# Patient Record
Sex: Female | Born: 1950 | Race: White | Hispanic: No | Marital: Married | Smoking: Current every day smoker
Health system: Southern US, Community
[De-identification: ages and names within clinical notes are randomized; demographics above are authoritative.]

## PROBLEM LIST (undated history)

## (undated) DIAGNOSIS — K279 Peptic ulcer, site unspecified, unspecified as acute or chronic, without hemorrhage or perforation: Secondary | ICD-10-CM

## (undated) DIAGNOSIS — K5900 Constipation, unspecified: Secondary | ICD-10-CM

## (undated) DIAGNOSIS — K219 Gastro-esophageal reflux disease without esophagitis: Secondary | ICD-10-CM

## (undated) DIAGNOSIS — J189 Pneumonia, unspecified organism: Secondary | ICD-10-CM

## (undated) DIAGNOSIS — M8580 Other specified disorders of bone density and structure, unspecified site: Secondary | ICD-10-CM

## (undated) DIAGNOSIS — B9681 Helicobacter pylori [H. pylori] as the cause of diseases classified elsewhere: Secondary | ICD-10-CM

## (undated) DIAGNOSIS — E785 Hyperlipidemia, unspecified: Secondary | ICD-10-CM

## (undated) HISTORY — DX: Helicobacter pylori (H. pylori) as the cause of diseases classified elsewhere: B96.81

## (undated) HISTORY — DX: Peptic ulcer, site unspecified, unspecified as acute or chronic, without hemorrhage or perforation: K27.9

## (undated) HISTORY — PX: APPENDECTOMY: SHX54

## (undated) HISTORY — PX: TUBAL LIGATION: SHX77

## (undated) HISTORY — DX: Hyperlipidemia, unspecified: E78.5

## (undated) HISTORY — DX: Other specified disorders of bone density and structure, unspecified site: M85.80

## (undated) HISTORY — DX: Constipation, unspecified: K59.00

---

## 2000-07-02 ENCOUNTER — Encounter: Payer: Self-pay | Admitting: Family Medicine

## 2000-07-02 ENCOUNTER — Encounter: Admission: RE | Admit: 2000-07-02 | Discharge: 2000-07-02 | Payer: Self-pay | Admitting: Family Medicine

## 2000-09-06 ENCOUNTER — Encounter: Payer: Self-pay | Admitting: Gastroenterology

## 2000-09-06 ENCOUNTER — Ambulatory Visit (HOSPITAL_COMMUNITY): Admission: RE | Admit: 2000-09-06 | Discharge: 2000-09-06 | Payer: Self-pay | Admitting: Gastroenterology

## 2001-07-04 ENCOUNTER — Encounter: Payer: Self-pay | Admitting: Family Medicine

## 2001-07-04 ENCOUNTER — Encounter: Admission: RE | Admit: 2001-07-04 | Discharge: 2001-07-04 | Payer: Self-pay | Admitting: Family Medicine

## 2001-07-11 ENCOUNTER — Other Ambulatory Visit: Admission: RE | Admit: 2001-07-11 | Discharge: 2001-07-11 | Payer: Self-pay | Admitting: Family Medicine

## 2002-07-08 ENCOUNTER — Encounter: Admission: RE | Admit: 2002-07-08 | Discharge: 2002-07-08 | Payer: Self-pay | Admitting: Family Medicine

## 2002-07-08 ENCOUNTER — Encounter: Payer: Self-pay | Admitting: Family Medicine

## 2002-07-22 ENCOUNTER — Encounter: Payer: Self-pay | Admitting: Family Medicine

## 2002-07-22 ENCOUNTER — Encounter: Admission: RE | Admit: 2002-07-22 | Discharge: 2002-07-22 | Payer: Self-pay | Admitting: Family Medicine

## 2003-07-21 ENCOUNTER — Encounter: Admission: RE | Admit: 2003-07-21 | Discharge: 2003-07-21 | Payer: Self-pay | Admitting: Family Medicine

## 2003-07-21 ENCOUNTER — Encounter: Payer: Self-pay | Admitting: Family Medicine

## 2003-07-23 ENCOUNTER — Encounter: Admission: RE | Admit: 2003-07-23 | Discharge: 2003-07-23 | Payer: Self-pay | Admitting: Family Medicine

## 2003-07-23 ENCOUNTER — Encounter: Payer: Self-pay | Admitting: Family Medicine

## 2004-07-28 ENCOUNTER — Encounter: Admission: RE | Admit: 2004-07-28 | Discharge: 2004-07-28 | Payer: Self-pay | Admitting: Family Medicine

## 2005-08-08 ENCOUNTER — Encounter: Admission: RE | Admit: 2005-08-08 | Discharge: 2005-08-08 | Payer: Self-pay | Admitting: Family Medicine

## 2005-08-31 ENCOUNTER — Ambulatory Visit: Payer: Self-pay | Admitting: Gastroenterology

## 2005-09-27 ENCOUNTER — Ambulatory Visit: Payer: Self-pay | Admitting: Gastroenterology

## 2005-10-04 ENCOUNTER — Ambulatory Visit: Payer: Self-pay | Admitting: Gastroenterology

## 2005-10-04 ENCOUNTER — Encounter (INDEPENDENT_AMBULATORY_CARE_PROVIDER_SITE_OTHER): Payer: Self-pay | Admitting: *Deleted

## 2005-11-14 ENCOUNTER — Ambulatory Visit: Payer: Self-pay | Admitting: Gastroenterology

## 2005-11-23 ENCOUNTER — Ambulatory Visit: Payer: Self-pay | Admitting: Gastroenterology

## 2006-08-09 ENCOUNTER — Encounter: Admission: RE | Admit: 2006-08-09 | Discharge: 2006-08-09 | Payer: Self-pay | Admitting: Family Medicine

## 2006-09-05 ENCOUNTER — Encounter: Admission: RE | Admit: 2006-09-05 | Discharge: 2006-09-05 | Payer: Self-pay | Admitting: Family Medicine

## 2007-08-12 ENCOUNTER — Encounter: Admission: RE | Admit: 2007-08-12 | Discharge: 2007-08-12 | Payer: Self-pay | Admitting: Family Medicine

## 2008-08-12 ENCOUNTER — Encounter: Admission: RE | Admit: 2008-08-12 | Discharge: 2008-08-12 | Payer: Self-pay | Admitting: Family Medicine

## 2008-08-25 ENCOUNTER — Other Ambulatory Visit: Admission: RE | Admit: 2008-08-25 | Discharge: 2008-08-25 | Payer: Self-pay | Admitting: Family Medicine

## 2008-11-27 ENCOUNTER — Encounter: Admission: RE | Admit: 2008-11-27 | Discharge: 2008-11-27 | Payer: Self-pay | Admitting: Family Medicine

## 2009-08-13 ENCOUNTER — Encounter: Admission: RE | Admit: 2009-08-13 | Discharge: 2009-08-13 | Payer: Self-pay | Admitting: Family Medicine

## 2009-12-16 ENCOUNTER — Telehealth (INDEPENDENT_AMBULATORY_CARE_PROVIDER_SITE_OTHER): Payer: Self-pay | Admitting: *Deleted

## 2010-08-15 ENCOUNTER — Encounter: Admission: RE | Admit: 2010-08-15 | Discharge: 2010-08-15 | Payer: Self-pay | Admitting: Family Medicine

## 2010-09-20 ENCOUNTER — Other Ambulatory Visit: Admission: RE | Admit: 2010-09-20 | Discharge: 2010-09-20 | Payer: Self-pay | Admitting: *Deleted

## 2010-10-05 ENCOUNTER — Encounter: Admission: RE | Admit: 2010-10-05 | Discharge: 2010-10-05 | Payer: Self-pay | Admitting: Internal Medicine

## 2011-02-16 ENCOUNTER — Other Ambulatory Visit: Payer: Self-pay | Admitting: Dermatology

## 2011-07-24 ENCOUNTER — Other Ambulatory Visit: Payer: Self-pay | Admitting: Family Medicine

## 2011-07-24 DIAGNOSIS — Z1231 Encounter for screening mammogram for malignant neoplasm of breast: Secondary | ICD-10-CM

## 2011-08-17 ENCOUNTER — Ambulatory Visit
Admission: RE | Admit: 2011-08-17 | Discharge: 2011-08-17 | Disposition: A | Discharge status: Home / Self Care | Payer: 59 | Source: Ambulatory Visit | Attending: Family Medicine | Admitting: Family Medicine

## 2011-08-17 DIAGNOSIS — Z1231 Encounter for screening mammogram for malignant neoplasm of breast: Secondary | ICD-10-CM

## 2011-09-25 ENCOUNTER — Other Ambulatory Visit (HOSPITAL_COMMUNITY)
Admission: RE | Admit: 2011-09-25 | Discharge: 2011-09-25 | Disposition: A | Discharge status: Home / Self Care | Payer: 59 | Source: Ambulatory Visit | Attending: Internal Medicine | Admitting: Internal Medicine

## 2011-09-25 ENCOUNTER — Other Ambulatory Visit: Payer: Self-pay | Admitting: Physician Assistant

## 2011-09-25 DIAGNOSIS — Z01419 Encounter for gynecological examination (general) (routine) without abnormal findings: Secondary | ICD-10-CM | POA: Insufficient documentation

## 2012-04-25 ENCOUNTER — Other Ambulatory Visit: Payer: Self-pay | Admitting: Dermatology

## 2012-07-10 ENCOUNTER — Other Ambulatory Visit: Payer: Self-pay | Admitting: Family Medicine

## 2012-07-10 DIAGNOSIS — Z1231 Encounter for screening mammogram for malignant neoplasm of breast: Secondary | ICD-10-CM

## 2012-08-19 ENCOUNTER — Ambulatory Visit
Admission: RE | Admit: 2012-08-19 | Discharge: 2012-08-19 | Disposition: A | Discharge status: Home / Self Care | Payer: 59 | Source: Ambulatory Visit | Attending: Family Medicine | Admitting: Family Medicine

## 2012-08-19 DIAGNOSIS — Z1231 Encounter for screening mammogram for malignant neoplasm of breast: Secondary | ICD-10-CM

## 2012-12-25 DIAGNOSIS — M8580 Other specified disorders of bone density and structure, unspecified site: Secondary | ICD-10-CM

## 2012-12-25 HISTORY — DX: Other specified disorders of bone density and structure, unspecified site: M85.80

## 2013-05-02 ENCOUNTER — Other Ambulatory Visit: Payer: Self-pay | Admitting: Dermatology

## 2013-06-12 ENCOUNTER — Other Ambulatory Visit: Payer: Self-pay | Admitting: Dermatology

## 2013-07-14 ENCOUNTER — Other Ambulatory Visit: Payer: Self-pay

## 2013-07-14 DIAGNOSIS — Z1231 Encounter for screening mammogram for malignant neoplasm of breast: Secondary | ICD-10-CM

## 2013-08-20 ENCOUNTER — Ambulatory Visit
Admission: RE | Admit: 2013-08-20 | Discharge: 2013-08-20 | Disposition: A | Discharge status: Home / Self Care | Payer: 59 | Source: Ambulatory Visit

## 2013-08-20 DIAGNOSIS — Z1231 Encounter for screening mammogram for malignant neoplasm of breast: Secondary | ICD-10-CM

## 2013-09-29 LAB — HM DEXA SCAN

## 2013-10-03 ENCOUNTER — Other Ambulatory Visit (HOSPITAL_COMMUNITY)
Admission: RE | Admit: 2013-10-03 | Discharge: 2013-10-03 | Disposition: A | Discharge status: Home / Self Care | Payer: 59 | Source: Ambulatory Visit | Attending: Family Medicine | Admitting: Family Medicine

## 2013-10-03 ENCOUNTER — Other Ambulatory Visit: Payer: Self-pay

## 2013-10-03 ENCOUNTER — Other Ambulatory Visit: Payer: Self-pay | Admitting: Family

## 2013-10-03 DIAGNOSIS — M8580 Other specified disorders of bone density and structure, unspecified site: Secondary | ICD-10-CM

## 2013-10-03 DIAGNOSIS — Z0000 Encounter for general adult medical examination without abnormal findings: Secondary | ICD-10-CM | POA: Insufficient documentation

## 2013-10-03 DIAGNOSIS — Z780 Asymptomatic menopausal state: Secondary | ICD-10-CM

## 2013-10-13 ENCOUNTER — Ambulatory Visit
Admission: RE | Admit: 2013-10-13 | Discharge: 2013-10-13 | Disposition: A | Discharge status: Home / Self Care | Payer: 59 | Source: Ambulatory Visit | Attending: Family | Admitting: Family

## 2013-10-13 DIAGNOSIS — Z780 Asymptomatic menopausal state: Secondary | ICD-10-CM

## 2013-10-13 DIAGNOSIS — M8580 Other specified disorders of bone density and structure, unspecified site: Secondary | ICD-10-CM

## 2014-07-15 ENCOUNTER — Other Ambulatory Visit: Payer: Self-pay

## 2014-07-15 DIAGNOSIS — Z1231 Encounter for screening mammogram for malignant neoplasm of breast: Secondary | ICD-10-CM

## 2014-08-21 ENCOUNTER — Encounter (INDEPENDENT_AMBULATORY_CARE_PROVIDER_SITE_OTHER): Payer: Self-pay

## 2014-08-21 ENCOUNTER — Ambulatory Visit
Admission: RE | Admit: 2014-08-21 | Discharge: 2014-08-21 | Disposition: A | Discharge status: Home / Self Care | Payer: 59 | Source: Ambulatory Visit

## 2014-08-21 DIAGNOSIS — Z1231 Encounter for screening mammogram for malignant neoplasm of breast: Secondary | ICD-10-CM

## 2014-09-24 DIAGNOSIS — E785 Hyperlipidemia, unspecified: Secondary | ICD-10-CM

## 2014-09-24 HISTORY — DX: Hyperlipidemia, unspecified: E78.5

## 2015-07-19 ENCOUNTER — Other Ambulatory Visit: Payer: Self-pay

## 2015-07-19 DIAGNOSIS — Z1231 Encounter for screening mammogram for malignant neoplasm of breast: Secondary | ICD-10-CM

## 2015-08-23 ENCOUNTER — Ambulatory Visit
Admission: RE | Admit: 2015-08-23 | Discharge: 2015-08-23 | Disposition: A | Discharge status: Home / Self Care | Payer: 59 | Source: Ambulatory Visit

## 2015-08-23 DIAGNOSIS — Z1231 Encounter for screening mammogram for malignant neoplasm of breast: Secondary | ICD-10-CM

## 2015-09-23 ENCOUNTER — Other Ambulatory Visit: Payer: Self-pay | Admitting: Orthopedic Surgery

## 2015-09-23 NOTE — H&P (Signed)
Allison Weiss is an 64 y.o. female.   CC / Reason for Visit: Left wrist injury HPI: This patient is a 64 year old female who presents for evaluation of a left wrist injury that occurred out of town on Tuesday, falling onto an outstretched hand.  She was evaluated emergency department in [place], and placed into a splint.  She has been using hydrocodone for pain.  No past medical history on file.  No past surgical history on file.  No family history on file. Social History:  has no tobacco, alcohol, and drug history on file.  Allergies: Allergies not on file  No prescriptions prior to admission    No results found for this or any previous visit (from the past 48 hour(s)). No results found.  Review of Systems  All other systems reviewed and are negative.   There were no vitals taken for this visit. Physical Exam  Constitutional:  WD, WN, NAD HEENT:  NCAT, EOMI Neuro/Psych:  Alert & oriented to person, place, and time; appropriate mood & affect Lymphatic: No generalized UE edema or lymphadenopathy Extremities / MSK:  Both UE are normal with respect to appearance, ranges of motion, joint stability, muscle strength/tone, sensation, & perfusion except as otherwise noted:  Short arm splint applied the left forearm.  Fingers are warm with brisk capillary refill, intact light touch sensibility in the radial, median, and ulnar nerve distributions with intact motor to the same.  No tenderness about the elbow.  Labs / Xrays:  4 views of left wrist ordered and obtained today reveal a dorsally translated and 40 dorsally tilted extra-articular distal radius fracture.  Assessment: Displaced angulated left distal radius fracture  Plan:  I discussed these findings with her.  I recommended surgical treatment to obtain and maintain an acceptable reduction of the fracture, optimizing proper healing and functional recovery.    The details of the operative procedure were discussed with the  patient.  Questions were invited and answered.  In addition to the goal of the procedure, the risks of the procedure to include but not limited to bleeding; infection; damage to the nerves or blood vessels that could result in bleeding, numbness, weakness, chronic pain, and the need for additional procedures; stiffness; the need for revision surgery; and anesthetic risks, were reviewed.  No specific outcome was guaranteed or implied.  Informed consent was obtained.  We will plan to proceed tomorrow at [facility].  THOMPSON, DAVID A. 09/23/2015, 5:12 PM

## 2015-09-24 ENCOUNTER — Ambulatory Visit (HOSPITAL_COMMUNITY): Payer: 59

## 2015-09-24 ENCOUNTER — Ambulatory Visit (HOSPITAL_BASED_OUTPATIENT_CLINIC_OR_DEPARTMENT_OTHER): Payer: 59 | Admitting: Anesthesiology

## 2015-09-24 ENCOUNTER — Encounter (HOSPITAL_BASED_OUTPATIENT_CLINIC_OR_DEPARTMENT_OTHER)
Admission: RE | Disposition: A | Discharge status: Home / Self Care | Payer: Self-pay | Source: Ambulatory Visit | Attending: Orthopedic Surgery

## 2015-09-24 ENCOUNTER — Encounter (HOSPITAL_BASED_OUTPATIENT_CLINIC_OR_DEPARTMENT_OTHER): Payer: Self-pay | Admitting: *Deleted

## 2015-09-24 ENCOUNTER — Ambulatory Visit (HOSPITAL_BASED_OUTPATIENT_CLINIC_OR_DEPARTMENT_OTHER)
Admission: RE | Admit: 2015-09-24 | Discharge: 2015-09-24 | Disposition: A | Discharge status: Home / Self Care | Payer: 59 | Source: Ambulatory Visit | Attending: Orthopedic Surgery | Admitting: Orthopedic Surgery

## 2015-09-24 DIAGNOSIS — F17200 Nicotine dependence, unspecified, uncomplicated: Secondary | ICD-10-CM | POA: Diagnosis not present

## 2015-09-24 DIAGNOSIS — W19XXXA Unspecified fall, initial encounter: Secondary | ICD-10-CM | POA: Insufficient documentation

## 2015-09-24 DIAGNOSIS — S52502A Unspecified fracture of the lower end of left radius, initial encounter for closed fracture: Secondary | ICD-10-CM | POA: Diagnosis present

## 2015-09-24 DIAGNOSIS — Z4789 Encounter for other orthopedic aftercare: Secondary | ICD-10-CM

## 2015-09-24 DIAGNOSIS — S52612A Displaced fracture of left ulna styloid process, initial encounter for closed fracture: Secondary | ICD-10-CM | POA: Diagnosis not present

## 2015-09-24 DIAGNOSIS — K219 Gastro-esophageal reflux disease without esophagitis: Secondary | ICD-10-CM | POA: Diagnosis not present

## 2015-09-24 HISTORY — DX: Pneumonia, unspecified organism: J18.9

## 2015-09-24 HISTORY — PX: OPEN REDUCTION INTERNAL FIXATION (ORIF) DISTAL RADIAL FRACTURE: SHX5989

## 2015-09-24 HISTORY — DX: Gastro-esophageal reflux disease without esophagitis: K21.9

## 2015-09-24 SURGERY — OPEN REDUCTION INTERNAL FIXATION (ORIF) DISTAL RADIUS FRACTURE
Anesthesia: Regional | Site: Wrist | Laterality: Left

## 2015-09-24 MED ORDER — MIDAZOLAM HCL 2 MG/2ML IJ SOLN
1.0000 mg | INTRAMUSCULAR | Status: DC | PRN
  Administered 2015-09-24: 1 mg via INTRAVENOUS

## 2015-09-24 MED ORDER — LIDOCAINE HCL (CARDIAC) 20 MG/ML IV SOLN
INTRAVENOUS | Status: AC
  Filled 2015-09-24: qty 5

## 2015-09-24 MED ORDER — GLYCOPYRROLATE 0.2 MG/ML IJ SOLN: 0.2000 mg | Freq: Once | INTRAMUSCULAR | Status: DC | PRN

## 2015-09-24 MED ORDER — ONDANSETRON HCL 4 MG/2ML IJ SOLN: 4.0000 mg | Freq: Four times a day (QID) | INTRAMUSCULAR | Status: DC | PRN

## 2015-09-24 MED ORDER — EPHEDRINE SULFATE 50 MG/ML IJ SOLN
INTRAMUSCULAR | Status: DC | PRN
  Administered 2015-09-24: 10 mg via INTRAVENOUS

## 2015-09-24 MED ORDER — PROPOFOL 500 MG/50ML IV EMUL
INTRAVENOUS | Status: AC
  Filled 2015-09-24: qty 50

## 2015-09-24 MED ORDER — MIDAZOLAM HCL 2 MG/2ML IJ SOLN
INTRAMUSCULAR | Status: AC
  Filled 2015-09-24: qty 4

## 2015-09-24 MED ORDER — ONDANSETRON HCL 4 MG/2ML IJ SOLN
INTRAMUSCULAR | Status: DC | PRN
  Administered 2015-09-24: 4 mg via INTRAVENOUS

## 2015-09-24 MED ORDER — EPHEDRINE SULFATE 50 MG/ML IJ SOLN
INTRAMUSCULAR | Status: AC
  Filled 2015-09-24: qty 1

## 2015-09-24 MED ORDER — BUPIVACAINE-EPINEPHRINE (PF) 0.5% -1:200000 IJ SOLN
INTRAMUSCULAR | Status: DC | PRN
  Administered 2015-09-24: 30 mL via PERINEURAL

## 2015-09-24 MED ORDER — LACTATED RINGERS IV SOLN
INTRAVENOUS | Status: DC
  Administered 2015-09-24: 11:00:00 via INTRAVENOUS

## 2015-09-24 MED ORDER — LACTATED RINGERS IV SOLN
INTRAVENOUS | Status: DC
  Administered 2015-09-24: 12:00:00 via INTRAVENOUS

## 2015-09-24 MED ORDER — CEFAZOLIN SODIUM-DEXTROSE 2-3 GM-% IV SOLR
INTRAVENOUS | Status: AC
  Filled 2015-09-24: qty 50

## 2015-09-24 MED ORDER — PROPOFOL 10 MG/ML IV BOLUS
INTRAVENOUS | Status: DC | PRN
Start: 2015-09-24 — End: 2015-09-24
  Administered 2015-09-24: 180 mg via INTRAVENOUS

## 2015-09-24 MED ORDER — CEFAZOLIN SODIUM-DEXTROSE 2-3 GM-% IV SOLR
2.0000 g | INTRAVENOUS | Status: AC
  Administered 2015-09-24: 2 g via INTRAVENOUS

## 2015-09-24 MED ORDER — FENTANYL CITRATE (PF) 100 MCG/2ML IJ SOLN
INTRAMUSCULAR | Status: AC
  Filled 2015-09-24: qty 4

## 2015-09-24 MED ORDER — FENTANYL CITRATE (PF) 100 MCG/2ML IJ SOLN
50.0000 ug | INTRAMUSCULAR | Status: DC | PRN
  Administered 2015-09-24: 50 ug via INTRAVENOUS

## 2015-09-24 MED ORDER — LIDOCAINE HCL (CARDIAC) 20 MG/ML IV SOLN
INTRAVENOUS | Status: DC | PRN
  Administered 2015-09-24: 50 mg via INTRAVENOUS

## 2015-09-24 MED ORDER — DEXAMETHASONE SODIUM PHOSPHATE 4 MG/ML IJ SOLN
INTRAMUSCULAR | Status: DC | PRN
  Administered 2015-09-24: 10 mg via INTRAVENOUS

## 2015-09-24 MED ORDER — DEXAMETHASONE SODIUM PHOSPHATE 10 MG/ML IJ SOLN
INTRAMUSCULAR | Status: AC
  Filled 2015-09-24: qty 1

## 2015-09-24 MED ORDER — MIDAZOLAM HCL 2 MG/2ML IJ SOLN
INTRAMUSCULAR | Status: AC
  Filled 2015-09-24: qty 2

## 2015-09-24 MED ORDER — ONDANSETRON HCL 4 MG/2ML IJ SOLN
INTRAMUSCULAR | Status: AC
  Filled 2015-09-24: qty 2

## 2015-09-24 MED ORDER — SCOPOLAMINE 1 MG/3DAYS TD PT72: 1.0000 | MEDICATED_PATCH | Freq: Once | TRANSDERMAL | Status: DC | PRN

## 2015-09-24 MED ORDER — FENTANYL CITRATE (PF) 100 MCG/2ML IJ SOLN: 25.0000 ug | INTRAMUSCULAR | Status: DC | PRN

## 2015-09-24 MED ORDER — FENTANYL CITRATE (PF) 100 MCG/2ML IJ SOLN
INTRAMUSCULAR | Status: AC
  Filled 2015-09-24: qty 2

## 2015-09-24 MED ORDER — LACTATED RINGERS IV SOLN: 500.0000 mL | INTRAVENOUS | Status: DC

## 2015-09-24 SURGICAL SUPPLY — 71 items
BANDAGE COBAN STERILE 2 (GAUZE/BANDAGES/DRESSINGS) IMPLANT
BIT DRILL 2 FAST STEP (BIT) ×2 IMPLANT
BIT DRILL 2.5X4 QC (BIT) ×2 IMPLANT
BLADE MINI RND TIP GREEN BEAV (BLADE) IMPLANT
BLADE SURG 15 STRL LF DISP TIS (BLADE) ×1 IMPLANT
BLADE SURG 15 STRL SS (BLADE) ×3
BNDG CMPR 9X4 STRL LF SNTH (GAUZE/BANDAGES/DRESSINGS) ×1
BNDG COHESIVE 4X5 TAN STRL (GAUZE/BANDAGES/DRESSINGS) ×3 IMPLANT
BNDG ESMARK 4X9 LF (GAUZE/BANDAGES/DRESSINGS) ×3 IMPLANT
BNDG GAUZE ELAST 4 BULKY (GAUZE/BANDAGES/DRESSINGS) ×6 IMPLANT
BRUSH SCRUB EZ PLAIN DRY (MISCELLANEOUS) IMPLANT
CANISTER SUCT 1200ML W/VALVE (MISCELLANEOUS) ×3 IMPLANT
CHLORAPREP W/TINT 26ML (MISCELLANEOUS) ×3 IMPLANT
CORDS BIPOLAR (ELECTRODE) ×3 IMPLANT
COVER BACK TABLE 60X90IN (DRAPES) ×3 IMPLANT
COVER MAYO STAND STRL (DRAPES) ×3 IMPLANT
CUFF TOURNIQUET SINGLE 18IN (TOURNIQUET CUFF) IMPLANT
CUFF TOURNIQUET SINGLE 24IN (TOURNIQUET CUFF) IMPLANT
DRAPE C-ARM 42X72 X-RAY (DRAPES) ×3 IMPLANT
DRAPE EXTREMITY T 121X128X90 (DRAPE) ×3 IMPLANT
DRAPE SURG 17X23 STRL (DRAPES) ×3 IMPLANT
DRSG ADAPTIC 3X8 NADH LF (GAUZE/BANDAGES/DRESSINGS) ×3 IMPLANT
DRSG EMULSION OIL 3X3 NADH (GAUZE/BANDAGES/DRESSINGS) IMPLANT
ELECT REM PT RETURN 9FT ADLT (ELECTROSURGICAL) ×3
ELECTRODE REM PT RTRN 9FT ADLT (ELECTROSURGICAL) ×1 IMPLANT
GAUZE SPONGE 4X4 12PLY STRL (GAUZE/BANDAGES/DRESSINGS) ×3 IMPLANT
GLOVE BIO SURGEON STRL SZ 6.5 (GLOVE) ×1 IMPLANT
GLOVE BIO SURGEON STRL SZ7.5 (GLOVE) ×3 IMPLANT
GLOVE BIO SURGEONS STRL SZ 6.5 (GLOVE) ×1
GLOVE BIOGEL PI IND STRL 7.0 (GLOVE) ×1 IMPLANT
GLOVE BIOGEL PI IND STRL 8 (GLOVE) ×1 IMPLANT
GLOVE BIOGEL PI INDICATOR 7.0 (GLOVE) ×6
GLOVE BIOGEL PI INDICATOR 8 (GLOVE) ×2
GLOVE ECLIPSE 6.5 STRL STRAW (GLOVE) ×3 IMPLANT
GOWN STRL REUS W/ TWL LRG LVL3 (GOWN DISPOSABLE) ×2 IMPLANT
GOWN STRL REUS W/TWL LRG LVL3 (GOWN DISPOSABLE) ×6
GOWN STRL REUS W/TWL XL LVL3 (GOWN DISPOSABLE) ×3 IMPLANT
NDL HYPO 25X1 1.5 SAFETY (NEEDLE) IMPLANT
NEEDLE HYPO 25X1 1.5 SAFETY (NEEDLE) IMPLANT
NS IRRIG 1000ML POUR BTL (IV SOLUTION) ×3 IMPLANT
PACK BASIN DAY SURGERY FS (CUSTOM PROCEDURE TRAY) ×3 IMPLANT
PADDING CAST ABS 4INX4YD NS (CAST SUPPLIES)
PADDING CAST ABS COTTON 4X4 ST (CAST SUPPLIES) IMPLANT
PEG SUBCHONDRAL SMOOTH 2.0X14 (Peg) ×2 IMPLANT
PEG SUBCHONDRAL SMOOTH 2.0X18 (Peg) ×2 IMPLANT
PEG SUBCHONDRAL SMOOTH 2.0X20 (Peg) ×4 IMPLANT
PEG SUBCHONDRAL SMOOTH 2.0X22 (Peg) ×4 IMPLANT
PENCIL BUTTON HOLSTER BLD 10FT (ELECTRODE) ×3 IMPLANT
PLATE SHORT 21.6X48.9 NRRW LT (Plate) ×2 IMPLANT
RUBBERBAND STERILE (MISCELLANEOUS) IMPLANT
SCREW BN 12X3.5XNS CORT TI (Screw) IMPLANT
SCREW CORT 3.5X10 LNG (Screw) ×2 IMPLANT
SCREW CORT 3.5X12 (Screw) ×6 IMPLANT
SLEEVE SCD COMPRESS KNEE MED (MISCELLANEOUS) ×3 IMPLANT
SLING ARM FOAM STRAP LRG (SOFTGOODS) IMPLANT
SPLINT PLASTER CAST XFAST 3X15 (CAST SUPPLIES) IMPLANT
SPLINT PLASTER XTRA FASTSET 3X (CAST SUPPLIES)
STOCKINETTE 6  STRL (DRAPES) ×2
STOCKINETTE 6 STRL (DRAPES) ×1 IMPLANT
SUCTION FRAZIER TIP 10 FR DISP (SUCTIONS) ×3 IMPLANT
SUT VIC AB 2-0 PS2 27 (SUTURE) ×3 IMPLANT
SUT VICRYL 4-0 PS2 18IN ABS (SUTURE) IMPLANT
SUT VICRYL RAPIDE 4-0 (SUTURE) IMPLANT
SUT VICRYL RAPIDE 4/0 PS 2 (SUTURE) ×3 IMPLANT
SYR BULB 3OZ (MISCELLANEOUS) ×3 IMPLANT
SYRINGE 10CC LL (SYRINGE) IMPLANT
TOWEL OR 17X24 6PK STRL BLUE (TOWEL DISPOSABLE) ×3 IMPLANT
TOWEL OR NON WOVEN STRL DISP B (DISPOSABLE) ×3 IMPLANT
TUBE CONNECTING 20'X1/4 (TUBING) ×1
TUBE CONNECTING 20X1/4 (TUBING) ×2 IMPLANT
UNDERPAD 30X30 (UNDERPADS AND DIAPERS) ×3 IMPLANT

## 2015-09-24 NOTE — Anesthesia Postprocedure Evaluation (Signed)
  Anesthesia Post-op Note  Patient: Allison Weiss  Procedure(s) Performed: Procedure(s): OPEN TREATMENT OF LEFT  DISTAL RADIUS FRACTURE (Left)  Patient Location: PACU  Anesthesia Type:GA combined with regional for post-op pain  Level of Consciousness: awake, alert , oriented and patient cooperative  Airway and Oxygen Therapy: Patient Spontanous Breathing  Post-op Pain: none  Post-op Assessment: Post-op Vital signs reviewed, Patient's Cardiovascular Status Stable, Respiratory Function Stable, Patent Airway, No signs of Nausea or vomiting and Pain level controlled              Post-op Vital Signs: Reviewed and stable  Last Vitals:  Filed Vitals:   09/24/15 1416  BP: 124/45  Pulse: 62  Temp: 36.6 C  Resp: 16    Complications: No apparent anesthesia complications

## 2015-09-24 NOTE — Discharge Instructions (Signed)

## 2015-09-24 NOTE — Anesthesia Preprocedure Evaluation (Signed)
Anesthesia Evaluation  Patient identified by MRN, date of birth, ID band Patient awake    Reviewed: Allergy & Precautions, NPO status , Patient's Chart, lab work & pertinent test results  Airway Mallampati: II   Neck ROM: full    Dental   Pulmonary Current Smoker,    breath sounds clear to auscultation       Cardiovascular negative cardio ROS   Rhythm:regular Rate:Normal     Neuro/Psych    GI/Hepatic GERD  ,  Endo/Other    Renal/GU      Musculoskeletal   Abdominal   Peds  Hematology   Anesthesia Other Findings   Reproductive/Obstetrics                             Anesthesia Physical Anesthesia Plan  ASA: II  Anesthesia Plan: General and Regional   Post-op Pain Management: MAC Combined w/ Regional for Post-op pain   Induction: Intravenous  Airway Management Planned: LMA  Additional Equipment:   Intra-op Plan:   Post-operative Plan:   Informed Consent: I have reviewed the patients History and Physical, chart, labs and discussed the procedure including the risks, benefits and alternatives for the proposed anesthesia with the patient or authorized representative who has indicated his/her understanding and acceptance.     Plan Discussed with: CRNA, Anesthesiologist and Surgeon  Anesthesia Plan Comments:         Anesthesia Quick Evaluation  

## 2015-09-24 NOTE — Interval H&P Note (Signed)
History and Physical Interval Note:  09/24/2015 12:31 PM  Allison Weiss  has presented today for surgery, with the diagnosis of LEFT DISTAL RADIUS FRACTURE  The various methods of treatment have been discussed with the patient and family. After consideration of risks, benefits and other options for treatment, the patient has consented to  Procedure(s): OPEN TREATMENT OF LEFT  DISTAL RADIUS FRACTURE (Left) as a surgical intervention .  The patient's history has been reviewed, patient examined, no change in status, stable for surgery.  I have reviewed the patient's chart and labs.  Questions were answered to the patient's satisfaction.     THOMPSON, DAVID A.

## 2015-09-24 NOTE — Anesthesia Procedure Notes (Addendum)
Anesthesia Regional Block:  Supraclavicular block  Pre-Anesthetic Checklist: ,, timeout performed, Correct Patient, Correct Site, Correct Laterality, Correct Procedure, Correct Position, site marked, Risks and benefits discussed,  Surgical consent,  Pre-op evaluation,  At surgeon's request and post-op pain management  Laterality: Left  Prep: chloraprep       Needles:  Injection technique: Single-shot  Needle Type: Echogenic Stimulator Needle     Needle Length: 5cm 5 cm Needle Gauge: 22 and 22 G    Additional Needles:  Procedures: ultrasound guided (picture in chart) and nerve stimulator Supraclavicular block  Nerve Stimulator or Paresthesia:  Response: biceps flexion, 0.45 mA,   Additional Responses:   Narrative:  Start time: 09/24/2015 11:57 AM End time: 09/24/2015 12:07 PM Injection made incrementally with aspirations every 5 mL.  Performed by: Personally  Anesthesiologist: HODIERNE, ADAM  Additional Notes: Functioning IV was confirmed and monitors were applied.  A 50mm 22ga Arrow echogenic stimulator needle was used. Sterile prep and drape,hand hygiene and sterile gloves were used.  Negative aspiration and negative test dose prior to incremental administration of local anesthetic. The patient tolerated the procedure well.  Ultrasound guidance: relevent anatomy identified, needle position confirmed, local anesthetic spread visualized around nerve(s), vascular puncture avoided.  Image printed for medical record.    Procedure Name: LMA Insertion Performed by: York Grice Pre-anesthesia Checklist: Patient identified, Timeout performed, Emergency Drugs available, Suction available and Patient being monitored Patient Re-evaluated:Patient Re-evaluated prior to inductionOxygen Delivery Method: Circle system utilized Preoxygenation: Pre-oxygenation with 100% oxygen Intubation Type: IV induction Ventilation: Mask ventilation without difficulty LMA: LMA inserted LMA Size:  4.0 Number of attempts: 1 Placement Confirmation: positive ETCO2 Tube secured with: Tape Dental Injury: Teeth and Oropharynx as per pre-operative assessment

## 2015-09-24 NOTE — Op Note (Signed)
09/24/2015  9:14 AM  PATIENT:  Allison Weiss  64 y.o. female  PRE-OPERATIVE DIAGNOSIS:  Displaced left extra-articular distal radius fracture  POST-OPERATIVE DIAGNOSIS:  Same  PROCEDURE:  ORIF left extra-articular distal radius fracture, 25607  SURGEON: Cliffton Asters. Janee Morn, MD  PHYSICIAN ASSISTANT: Danielle Rankin, OPA-C  ANESTHESIA:  regional and general  SPECIMENS:  None  DRAINS: None  EBL:  less than 50 mL  PREOPERATIVE INDICATIONS:  Allison Weiss is a  64 y.o. female with a displaced left extra-articular distal radius fracture  The risks benefits and alternatives were discussed with the patient preoperatively including but not limited to the risks of infection, bleeding, nerve injury, cardiopulmonary complications, the need for revision surgery, among others, and the patient verbalized understanding and consented to proceed.  OPERATIVE IMPLANTS: Biomet DVR short, narrow plate and pegs/screws   OPERATIVE PROCEDURE: After receiving prophylactic antibiotics and a regional block, the patient was escorted to the operative theatre and placed in a supine position. General anesthesia was administered.  A surgical "time-out" was performed during which the planned procedure, proposed operative site, and the correct patient identity were compared to the operative consent and agreement confirmed by the circulating nurse according to current facility policy. Following application of a tourniquet to the operative extremity, the exposed skin was pre-scrub with Hibiclens scrub brush and then was prepped with Chloraprep and draped in the usual sterile fashion. The limb was exsanguinated with an Esmarch bandage and the tourniquet inflated to approximately higher than systolic BP.   A sinusoidal-shaped incision was marked and made over the FCR axis and the distal forearm. The skin was incised sharply with scalpel, subcutaneous tissues with blunt and spreading dissection. The FCR axis  was exploited deeply. The pronator quadratus was reflected in an L-shaped ulnarly. The fracture was inspected and provisionally reduced.  This was confirmed fluoroscopically. The appropriately sized plate was selected and found to fit well. It was placed in its provisional alignment of the radius and this was confirmed fluoroscopically.  It was secured to the radius with a screw through the slotted hole.  Additional adjustments were made as necessary, and the distal holes were all drilled and filled.  Peg/screw length distally was selected on the shorter side of measurements to minimize the risk for dorsal cortical penetration. The remainder of the proximal holes were drilled and filled.   Final images were obtained and the DRUJ was examined for stability. It was found to be sufficiently stable. The wound was then copiously irrigated and the brachioradialis repaired with 2-0 Vicryl Rapide suture followed by repair of the pronator quadratus with the same suture type. Tourniquet was released and additional hemostasis obtained and the skin was closed with 2-0 Vicryl deep dermal buried sutures followed by running 4-0 Vicryl Rapide horizontal mattress suture in the skin. A bulky dressing with a volar plaster component was applied and she was taken to room stable condition.  DISPOSITION: The patient will be discharged home today with typical post-op instructions, returning in 10-15 days for reevaluation with new x-rays of the affected wrist out of the splint to include an inclined lateral and then transition to therapy to have a custom splint constructed and begin rehabilitation.

## 2015-09-24 NOTE — Progress Notes (Signed)
Assisted Dr. Hodierne with left, ultrasound guided, supraclavicular block. Side rails up, monitors on throughout procedure. See vital signs in flow sheet. Tolerated Procedure well. 

## 2015-09-24 NOTE — Transfer of Care (Signed)
Immediate Anesthesia Transfer of Care Note  Patient: Allison Weiss  Procedure(s) Performed: Procedure(s): OPEN TREATMENT OF LEFT  DISTAL RADIUS FRACTURE (Left)  Patient Location: PACU  Anesthesia Type:General  Level of Consciousness: awake, alert  and oriented  Airway & Oxygen Therapy: Patient Spontanous Breathing and Patient connected to face mask oxygen  Post-op Assessment: Report given to RN and Post -op Vital signs reviewed and stable  Post vital signs: Reviewed and stable  Last Vitals:  Filed Vitals:   09/24/15 1203  BP: 110/45  Pulse: 57  Temp:   Resp: 16    Complications: No apparent anesthesia complications

## 2015-09-27 ENCOUNTER — Encounter (HOSPITAL_BASED_OUTPATIENT_CLINIC_OR_DEPARTMENT_OTHER): Payer: Self-pay | Admitting: Orthopedic Surgery

## 2015-09-30 LAB — HM PAP SMEAR

## 2015-10-06 ENCOUNTER — Other Ambulatory Visit (HOSPITAL_COMMUNITY)
Admission: RE | Admit: 2015-10-06 | Discharge: 2015-10-06 | Disposition: A | Discharge status: Home / Self Care | Payer: 59 | Source: Ambulatory Visit | Attending: Family Medicine | Admitting: Family Medicine

## 2015-10-06 ENCOUNTER — Other Ambulatory Visit: Payer: Self-pay

## 2015-10-06 DIAGNOSIS — Z01419 Encounter for gynecological examination (general) (routine) without abnormal findings: Secondary | ICD-10-CM | POA: Diagnosis not present

## 2015-10-07 LAB — CYTOLOGY - PAP

## 2015-12-29 LAB — HM COLONOSCOPY

## 2016-07-17 ENCOUNTER — Other Ambulatory Visit: Payer: Self-pay | Admitting: Family

## 2016-07-17 DIAGNOSIS — Z1231 Encounter for screening mammogram for malignant neoplasm of breast: Secondary | ICD-10-CM

## 2016-07-30 LAB — HM MAMMOGRAPHY

## 2016-08-23 ENCOUNTER — Ambulatory Visit
Admission: RE | Admit: 2016-08-23 | Discharge: 2016-08-23 | Disposition: A | Discharge status: Home / Self Care | Payer: Medicare Other | Source: Ambulatory Visit | Attending: Family | Admitting: Family

## 2016-08-23 DIAGNOSIS — Z1231 Encounter for screening mammogram for malignant neoplasm of breast: Secondary | ICD-10-CM

## 2016-11-18 IMAGING — RF DG WRIST 2V*L*
1 series · 3 of 3 positions shown · non-contrast
Comparison: None.

CLINICAL DATA: Distal radius fracture fixation.

EXAM:
DG C-ARM 61-120 MIN; LEFT WRIST - 2 VIEW

[Series 1: run · 3 of 3 slices shown]
[im 1/3]
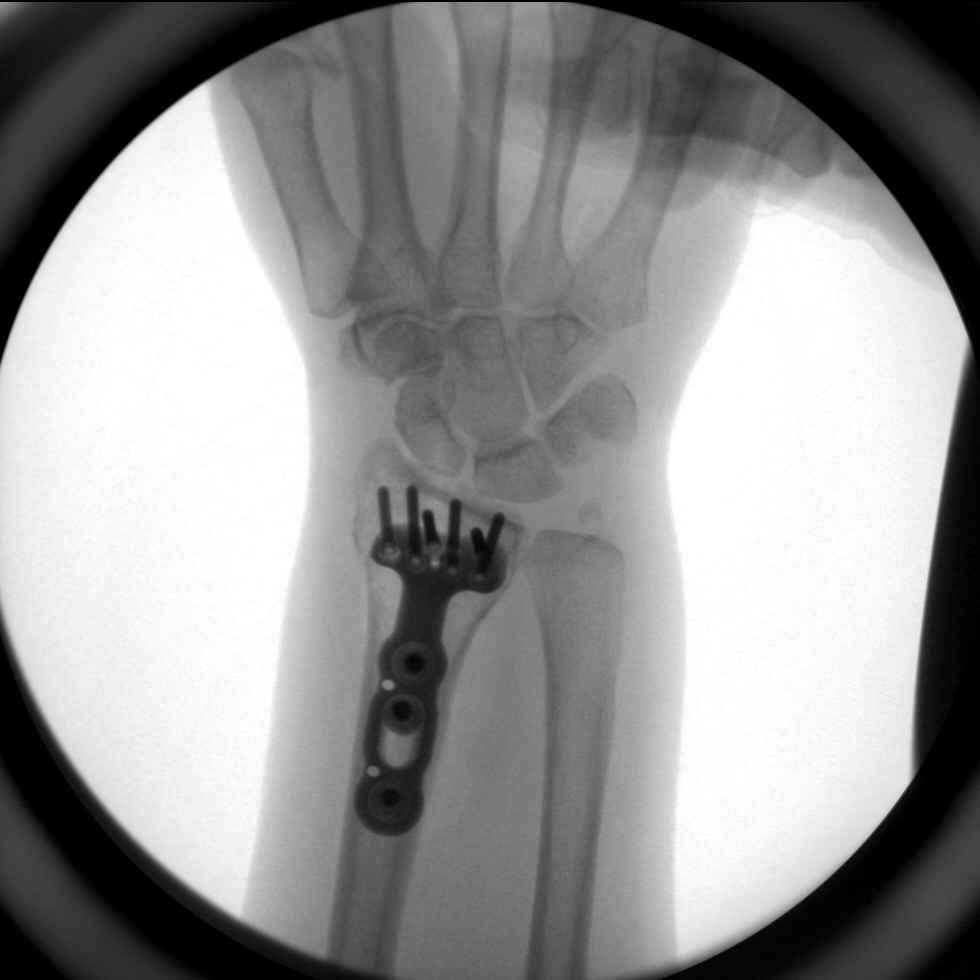
[im 2/3]
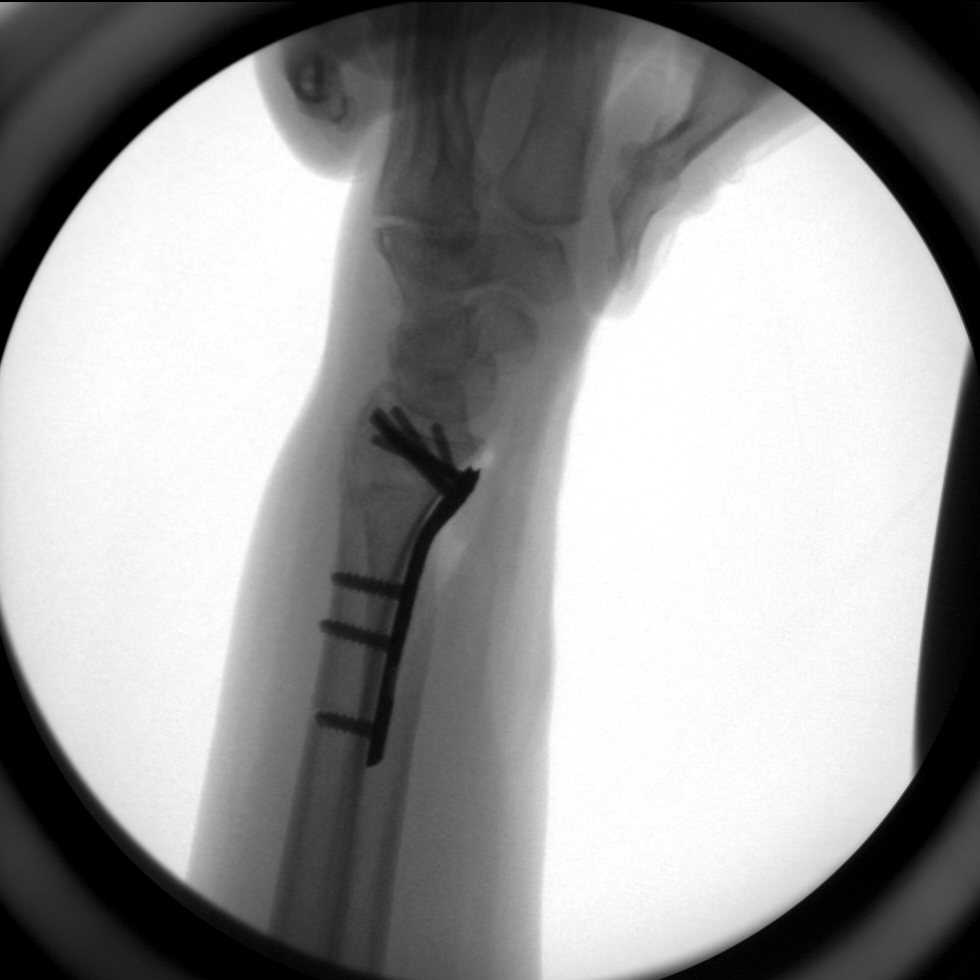
[im 3/3]
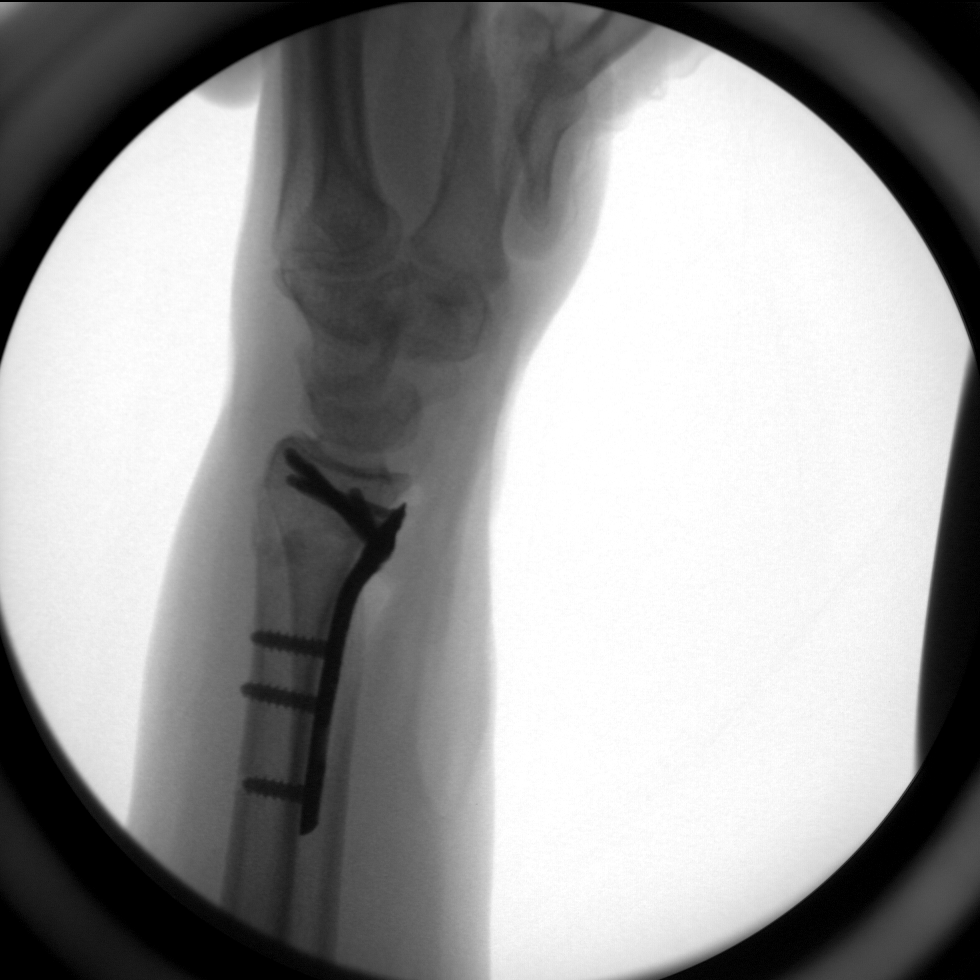

[3 of 3 positions shown; findings below may reference images not displayed]

FINDINGS: There is a volar plate and multiple screws transfixing the distal
radius fracture or anatomic reduction and alignment. No complicating
features. Ulnar styloid avulsion fracture noted.
IMPRESSION: Open reduction and internal fixation of the distal radius fracture
with anatomic alignment.

## 2017-03-16 ENCOUNTER — Other Ambulatory Visit: Payer: Self-pay | Admitting: Family

## 2017-03-16 ENCOUNTER — Other Ambulatory Visit (HOSPITAL_COMMUNITY): Payer: Self-pay | Admitting: Psychiatry

## 2017-03-16 DIAGNOSIS — E2839 Other primary ovarian failure: Secondary | ICD-10-CM

## 2017-03-16 DIAGNOSIS — M8580 Other specified disorders of bone density and structure, unspecified site: Secondary | ICD-10-CM

## 2017-03-23 ENCOUNTER — Ambulatory Visit
Admission: RE | Admit: 2017-03-23 | Discharge: 2017-03-23 | Disposition: A | Discharge status: Home / Self Care | Payer: Medicare Other | Source: Ambulatory Visit | Attending: Family | Admitting: Family

## 2017-03-23 DIAGNOSIS — M8580 Other specified disorders of bone density and structure, unspecified site: Secondary | ICD-10-CM

## 2017-03-23 DIAGNOSIS — E2839 Other primary ovarian failure: Secondary | ICD-10-CM

## 2017-06-04 ENCOUNTER — Ambulatory Visit (INDEPENDENT_AMBULATORY_CARE_PROVIDER_SITE_OTHER): Payer: Medicare Other | Admitting: Physician Assistant

## 2017-06-04 ENCOUNTER — Encounter: Payer: Self-pay | Admitting: Physician Assistant

## 2017-06-04 VITALS — BP 126/74 | HR 56 | Temp 97.9°F | Resp 16 | Ht 62.0 in | Wt 124.6 lb

## 2017-06-04 DIAGNOSIS — E785 Hyperlipidemia, unspecified: Secondary | ICD-10-CM

## 2017-06-04 DIAGNOSIS — E559 Vitamin D deficiency, unspecified: Secondary | ICD-10-CM

## 2017-06-04 DIAGNOSIS — M65311 Trigger thumb, right thumb: Secondary | ICD-10-CM | POA: Diagnosis not present

## 2017-06-04 DIAGNOSIS — K219 Gastro-esophageal reflux disease without esophagitis: Secondary | ICD-10-CM

## 2017-06-04 NOTE — Progress Notes (Addendum)
Patient ID: Allison Weiss MRN: [id number], DOB: 12/21/1951, 66 y.o. Date of Encounter: @DATE @  Chief Complaint:  Chief Complaint  Patient presents with  . New Patient (Initial Visit)    HPI: 66 y.o. year old female  presents as a New Patient to Establish Care.   She states that she was going to [facility] for her primary care.   States that she had a complete physical exam performed at [facility] in either March or April--2018. States that she had signed a release for records to be sent. States that her mammogram was done in either October or November--2017.  States that they told her that her cholesterol was a little bit high and had recommended medication in the past that she didn't take the medicine says that it wasn't that high.  States that the only prescription medicine she takes his omeprazole and she does take it every single every day on a daily basis not prn basis  Says that she does have a history of vitamin D deficiency. Is on the vitamin D supplement for this.  States that she has been having problems with her right thumb for the past one week. She is feeling some achy pain at the base of the thumb and then when she bends and straightens the thumb there is a popping clicking in the thumb. She shows me an old prescription bottle of meloxicam and says that she was prescribed that after a left wrist fracture and subsequent tendinitis/ arthritis. States that she has been taking the meloxicam over the past week to see if it would help with the right thumb but it isn't helping at all.  Asked if patient works outside of the home or had worked outside of the home and she says no. I asked if she does a lot of repetitive motion work with her hands but she does not and has not in the past.  She has no other concerns to address today.    ADDENDUM ADDED 06/25/2017----RECEIVED RECORDS FROM [facility]----- Her last complete physical exam/Medicare screening  was 03/15/2017. Records include her immunization record--- I will have staff abstract this information. I will also have staff abstract medications, past medical history, surgical history, family history, allergies. Other specific records included: Colonoscopy 12/29/2015 did show polyps. Performed by Dr. Loreta Ave. I do not see her recommendation of when that colonoscopy due. Note from Dr. Loreta Ave states to repeat colonoscopy in 5-10 years for surveillance. Pap smear 10/06/2015 ---  Negative Bone Density scan is performed at the breast center at [facility] that should be in epic. It was performed 03/23/2017. T score -2.2. Osteopenia. Mammogram also performed at the [facility] [place] performed 08/23/16 negative     Past Medical History:  Diagnosis Date  . GERD (gastroesophageal reflux disease)   . Pneumonia    1990's (once)     Home Meds: Outpatient Medications Prior to Visit  Medication Sig Dispense Refill  . aspirin 81 MG tablet Take 81 mg by mouth 3 (three) times a week.    . calcium-vitamin D (OSCAL WITH D) 500-200 MG-UNIT tablet Take 1 tablet by mouth.    . Cholecalciferol (VITAMIN D3) 2000 UNITS TABS Take by mouth.    . Multiple Vitamins-Minerals (CENTRUM SILVER PO) Take by mouth 1 day or 1 dose.    . Omega-3 Fatty Acids (FISH OIL) 1000 MG CAPS Take by mouth 1 day or 1 dose.    Marland Kitchen omeprazole (PRILOSEC) 40 MG capsule Take 40  mg by mouth daily.    . vitamin E (VITAMIN E) 400 UNIT capsule Take 400 Units by mouth daily.     No facility-administered medications prior to visit.     Allergies:  Allergies  Allergen Reactions  . Codeine Anaphylaxis    Social History   Social History  . Marital status: Married    Spouse name: N/A  . Number of children: N/A  . Years of education: N/A   Occupational History  . Not on file.   Social History Main Topics  . Smoking status: Current Every Day Smoker    Packs/day: 1.00    Years: 52.00    Types: Cigarettes  . Smokeless tobacco:  Never Used  . Alcohol use No  . Drug use: No  . Sexual activity: Not on file   Other Topics Concern  . Not on file   Social History Narrative  . No narrative on file    No family history on file.   Review of Systems:  See HPI for pertinent ROS. All other ROS negative.    Physical Exam: Blood pressure 126/74, pulse (!) 56, temperature 97.9 F (36.6 C), temperature source Oral, resp. rate 16, height 5\' 2"  (1.575 m), weight 124 lb 9.6 oz (56.5 kg), SpO2 98 %., Body mass index is 22.79 kg/m. General: WNWD WF. Appears in no acute distress. Neck: Supple. No thyromegaly. No lymphadenopathy. Lungs: Clear bilaterally to auscultation without wheezes, rales, or rhonchi. Breathing is unlabored. Heart: RRR with S1 S2. No murmurs, rubs, or gallops. Musculoskeletal:  Strength and tone normal for age. Right Thumb-- tenderness with palpation of base of thumb.  When she flexes and extends the thumb, there is popping movement Extremities/Skin: Warm and dry.  Neuro: Alert and oriented X 3. Moves all extremities spontaneously. Gait is normal. CNII-XII grossly in tact. Psych:  Responds to questions appropriately with a normal affect.     ASSESSMENT AND PLAN:  66 y.o. year old female with  1. Vitamin D deficiency --She is on Vit D supplement  2. Hyperlipidemia, unspecified hyperlipidemia type --She was recommended to take cholesterol medication but she deferred. We'll recheck lipid panel at her next CPE  3. Gastroesophageal reflux disease, esophagitis presence not specified --She takes omeprazole daily. This controls her GERD symptoms.  4. Trigger finger of right thumb [facility]. She states that she has gone to that practice in the past. - Ambulatory referral to Orthopedic Surgery    ADDENDUM ADDED 06/25/2017----RECEIVED RECORDS FROM [facility]----- Her last complete physical exam/Medicare screening was 03/15/2017. Records include  her immunization record--- I will have staff abstract this information. I will also have staff abstract medications, past medical history, surgical history, family history, allergies. Other specific records included: Colonoscopy 12/29/2015 did show polyps. Performed by Dr. Loreta AveMann. I do not see her recommendation of when that colonoscopy due. Note from Dr. Loreta AveMann states to repeat colonoscopy in 5-10 years for surveillance. Pap smear 10/06/2015 ---  Negative Bone Density [facility] that should be in epic. It was performed 03/23/2017. T score -2.2. Osteopenia. Mammogram also performed at the [facility] [place] performed 08/23/16 negative    She will schedule complete physical exam for May 2019 so we can make sure that it is a full year between her last CPE and her upcoming CPE. She will schedule this for early morning and come fasting to that appointment.  Follow-up sooner if needed.  Murray Hodgkins [place], [place], [facility] 06/04/2017 9:23 AM

## 2017-10-01 ENCOUNTER — Other Ambulatory Visit: Payer: Self-pay | Admitting: Physician Assistant

## 2017-10-01 DIAGNOSIS — Z1231 Encounter for screening mammogram for malignant neoplasm of breast: Secondary | ICD-10-CM

## 2017-10-11 ENCOUNTER — Telehealth: Payer: Self-pay | Admitting: Family Medicine

## 2017-10-11 NOTE — Telephone Encounter (Signed)
-----   Message from Birdie HopesSusan S Bullins sent at 10/09/2017  4:33 PM EDT ----- Patient is calling to talk with you regarding getting a shingles vaccine  Please call her at [phone]

## 2017-10-11 NOTE — Telephone Encounter (Signed)
Left pt message, she is on Shingrix list and we will call her when available.  She can also check with her pharmacy.

## 2017-10-23 ENCOUNTER — Ambulatory Visit
Admission: RE | Admit: 2017-10-23 | Discharge: 2017-10-23 | Disposition: A | Discharge status: Home / Self Care | Payer: Medicare Other | Source: Ambulatory Visit | Attending: Physician Assistant | Admitting: Physician Assistant

## 2017-10-23 DIAGNOSIS — Z1231 Encounter for screening mammogram for malignant neoplasm of breast: Secondary | ICD-10-CM

## 2017-11-02 ENCOUNTER — Ambulatory Visit (INDEPENDENT_AMBULATORY_CARE_PROVIDER_SITE_OTHER): Payer: Medicare Other

## 2017-11-02 DIAGNOSIS — Z23 Encounter for immunization: Secondary | ICD-10-CM | POA: Diagnosis not present

## 2017-11-02 NOTE — Progress Notes (Signed)
Patient was seen in office for shingrix vaccine and flu vaccine.Patient received the shingrix vaccine in her left deltoid and the flu vaccine in her right deltoid.Patient tolerated well

## 2017-12-11 ENCOUNTER — Encounter: Payer: Self-pay | Admitting: Family Medicine

## 2018-02-06 ENCOUNTER — Ambulatory Visit: Payer: Medicare Other

## 2018-02-06 ENCOUNTER — Ambulatory Visit (INDEPENDENT_AMBULATORY_CARE_PROVIDER_SITE_OTHER): Payer: Medicare Other | Admitting: Family Medicine

## 2018-02-06 DIAGNOSIS — Z23 Encounter for immunization: Secondary | ICD-10-CM | POA: Diagnosis not present

## 2018-02-06 NOTE — Progress Notes (Signed)
Pt tolerated injection well. Tried to pull up on [employer] and it would not pull up pt. Informed Carollee HerterShannon and it appears that her [employer] covered it last time. Per Carollee HerterShannon ok to give and will [facility].

## 2018-04-25 ENCOUNTER — Encounter: Payer: Medicare Other | Admitting: Physician Assistant
